# Patient Record
Sex: Female | Born: 1955
Health system: Southern US, Community
[De-identification: ages and names within clinical notes are randomized; demographics above are authoritative.]

## PROBLEM LIST (undated history)

## (undated) DIAGNOSIS — K8020 Calculus of gallbladder without cholecystitis without obstruction: Secondary | ICD-10-CM

## (undated) DIAGNOSIS — K589 Irritable bowel syndrome without diarrhea: Secondary | ICD-10-CM

## (undated) DIAGNOSIS — Z8659 Personal history of other mental and behavioral disorders: Secondary | ICD-10-CM

## (undated) DIAGNOSIS — K219 Gastro-esophageal reflux disease without esophagitis: Secondary | ICD-10-CM

## (undated) HISTORY — PX: CHOLECYSTECTOMY: SHX55

## (undated) HISTORY — PX: APPENDECTOMY: SHX54

## (undated) HISTORY — DX: Personal history of other mental and behavioral disorders: Z86.59

## (undated) HISTORY — DX: Irritable bowel syndrome without diarrhea: K58.9

## (undated) HISTORY — DX: Gastro-esophageal reflux disease without esophagitis: K21.9

## (undated) HISTORY — DX: Calculus of gallbladder without cholecystitis without obstruction: K80.20

---

## 1998-03-13 ENCOUNTER — Other Ambulatory Visit: Admission: RE | Admit: 1998-03-13 | Discharge: 1998-03-13 | Payer: Self-pay | Admitting: Obstetrics and Gynecology

## 1999-07-05 ENCOUNTER — Other Ambulatory Visit: Admission: RE | Admit: 1999-07-05 | Discharge: 1999-07-05 | Payer: Self-pay | Admitting: *Deleted

## 2000-11-05 ENCOUNTER — Encounter: Payer: Self-pay | Admitting: Internal Medicine

## 2000-11-05 ENCOUNTER — Ambulatory Visit (HOSPITAL_COMMUNITY): Admission: RE | Admit: 2000-11-05 | Discharge: 2000-11-05 | Payer: Self-pay | Admitting: Internal Medicine

## 2002-04-25 ENCOUNTER — Other Ambulatory Visit: Admission: RE | Admit: 2002-04-25 | Discharge: 2002-04-25 | Payer: Self-pay | Admitting: *Deleted

## 2003-08-08 ENCOUNTER — Other Ambulatory Visit: Admission: RE | Admit: 2003-08-08 | Discharge: 2003-08-08 | Payer: Self-pay | Admitting: Obstetrics and Gynecology

## 2004-10-03 ENCOUNTER — Other Ambulatory Visit: Admission: RE | Admit: 2004-10-03 | Discharge: 2004-10-03 | Payer: Self-pay | Admitting: Obstetrics and Gynecology

## 2005-04-10 ENCOUNTER — Ambulatory Visit: Payer: Self-pay | Admitting: Family Medicine

## 2005-07-01 ENCOUNTER — Ambulatory Visit: Payer: Self-pay | Admitting: Family Medicine

## 2005-09-15 ENCOUNTER — Ambulatory Visit: Payer: Self-pay | Admitting: Family Medicine

## 2005-11-05 ENCOUNTER — Ambulatory Visit (HOSPITAL_COMMUNITY): Admission: RE | Admit: 2005-11-05 | Discharge: 2005-11-06 | Payer: Self-pay | Admitting: General Surgery

## 2005-12-26 ENCOUNTER — Other Ambulatory Visit: Admission: RE | Admit: 2005-12-26 | Discharge: 2005-12-26 | Payer: Self-pay | Admitting: Obstetrics and Gynecology

## 2006-05-28 ENCOUNTER — Ambulatory Visit: Payer: Self-pay | Admitting: Internal Medicine

## 2006-07-14 ENCOUNTER — Ambulatory Visit: Payer: Self-pay | Admitting: Internal Medicine

## 2006-12-01 IMAGING — RF DG CHOLANGIOGRAM OPERATIVE
1 series · 5 of 5 positions shown · non-contrast
Comparison: none

CLINICAL DATA: Lap choley.
 OPERATIVE CHOLANGIOGRAM:

[Series 1: run · 2 acquisitions, 5 frames shown]
[im 1/2]
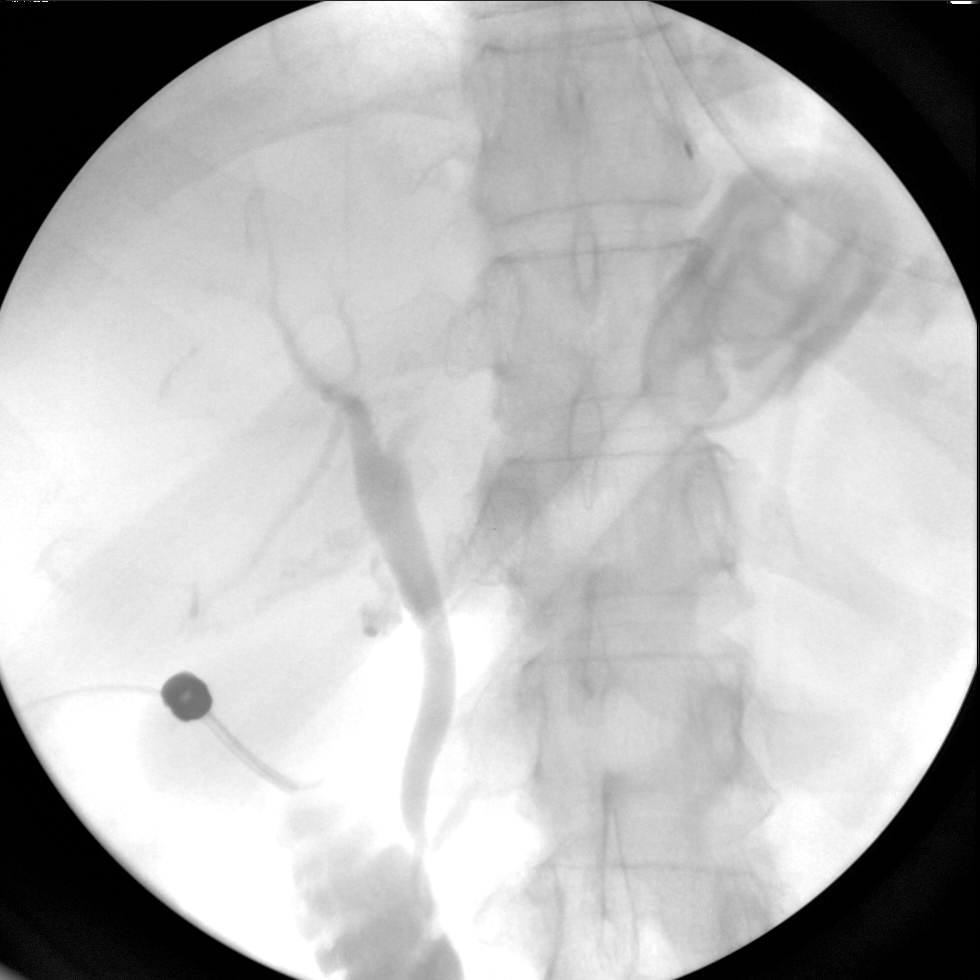
[im 1/2]
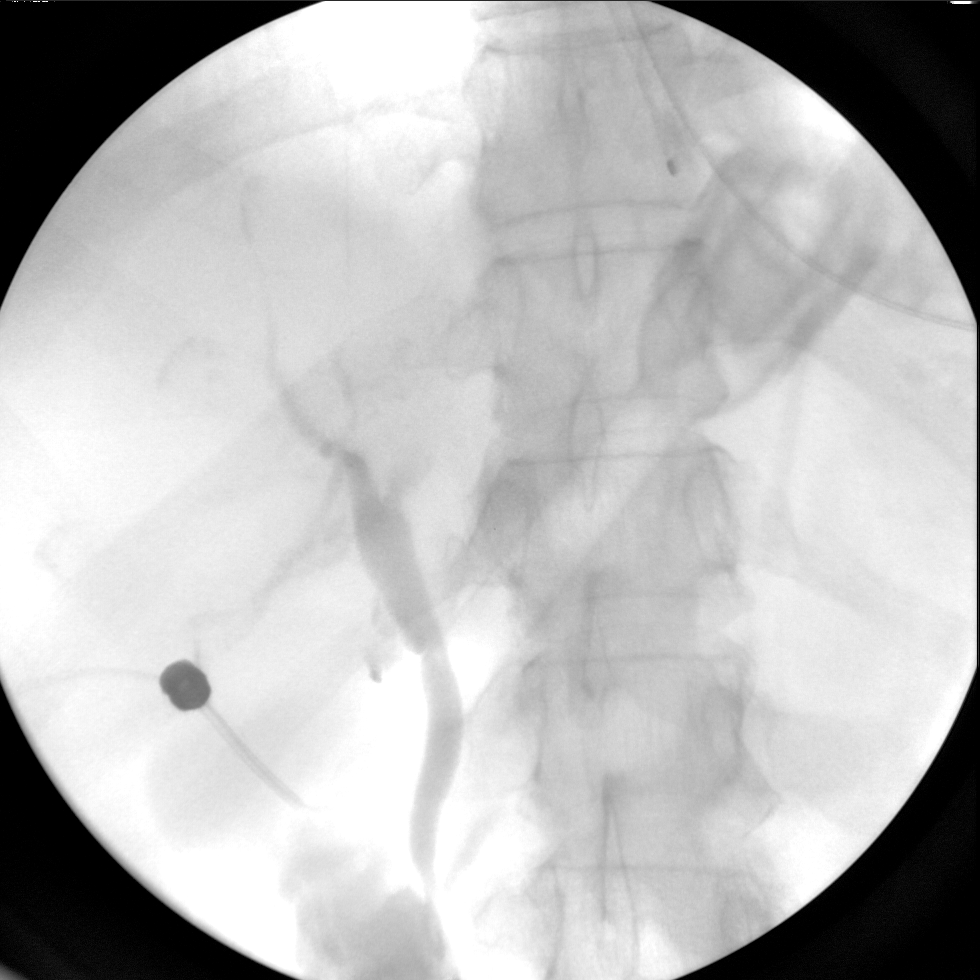
[im 1/2]
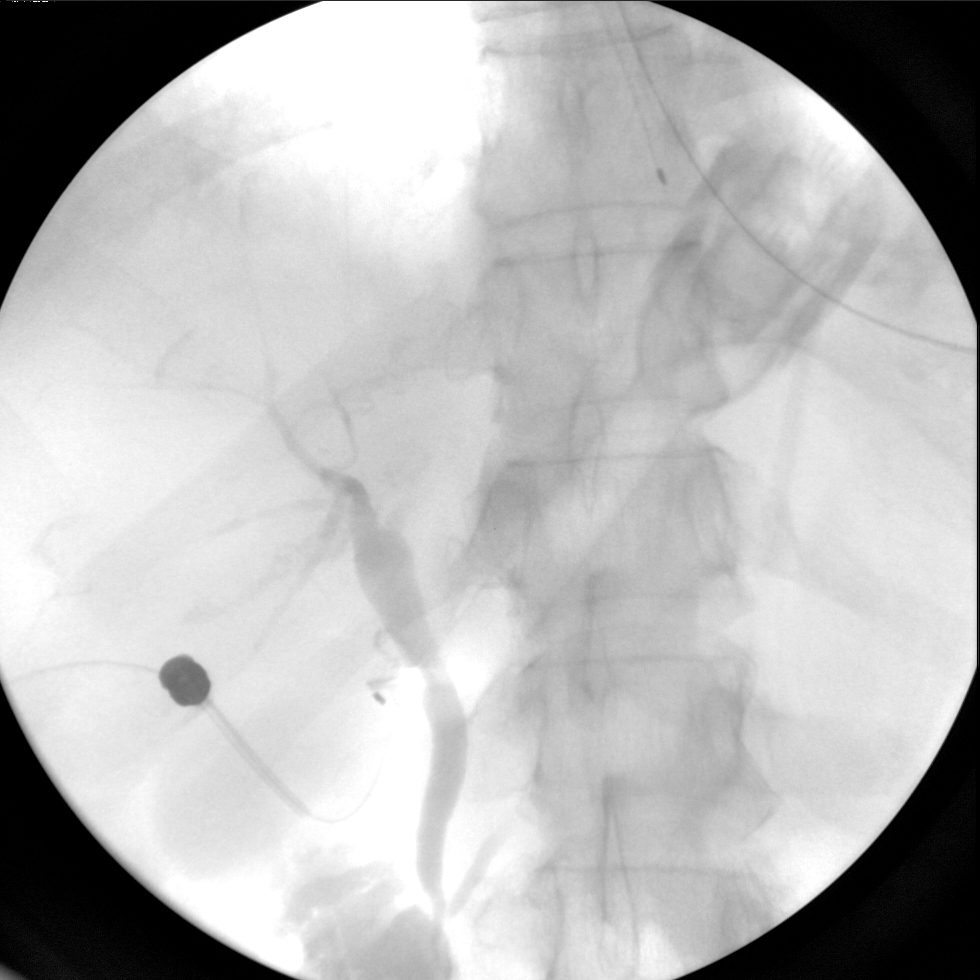
[im 1/2]
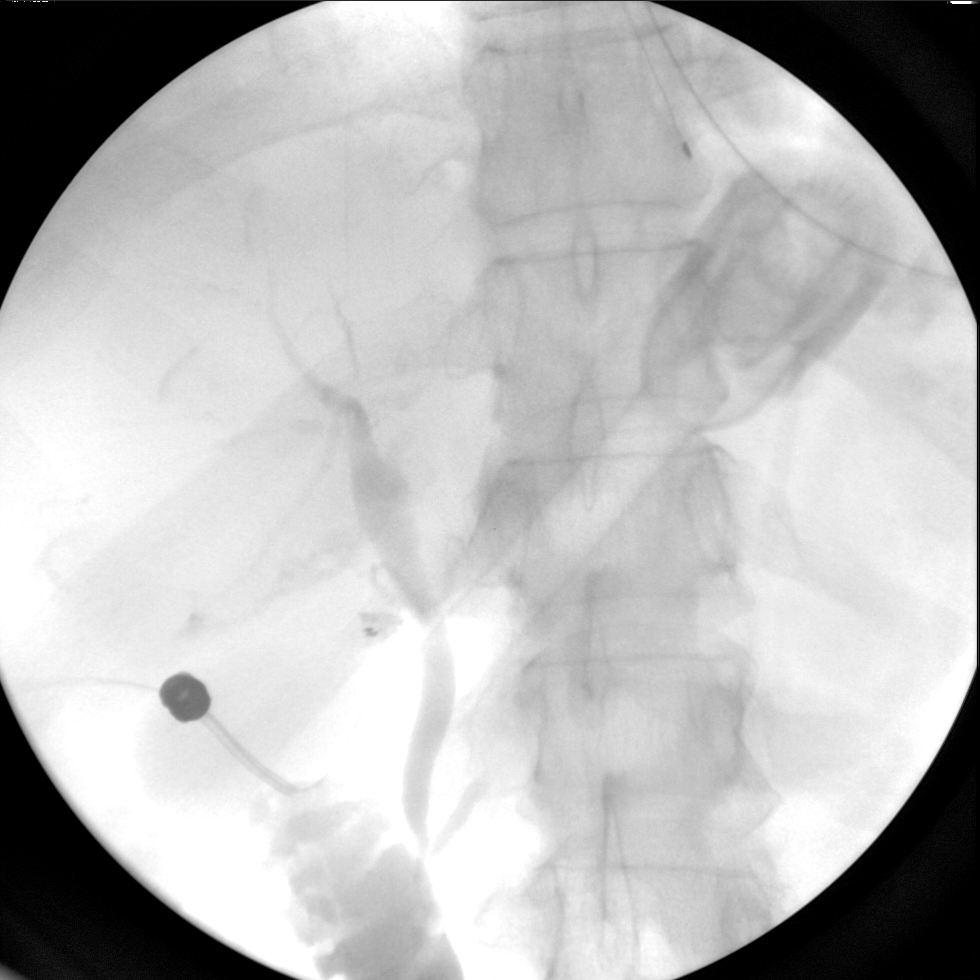
[im 2/2]
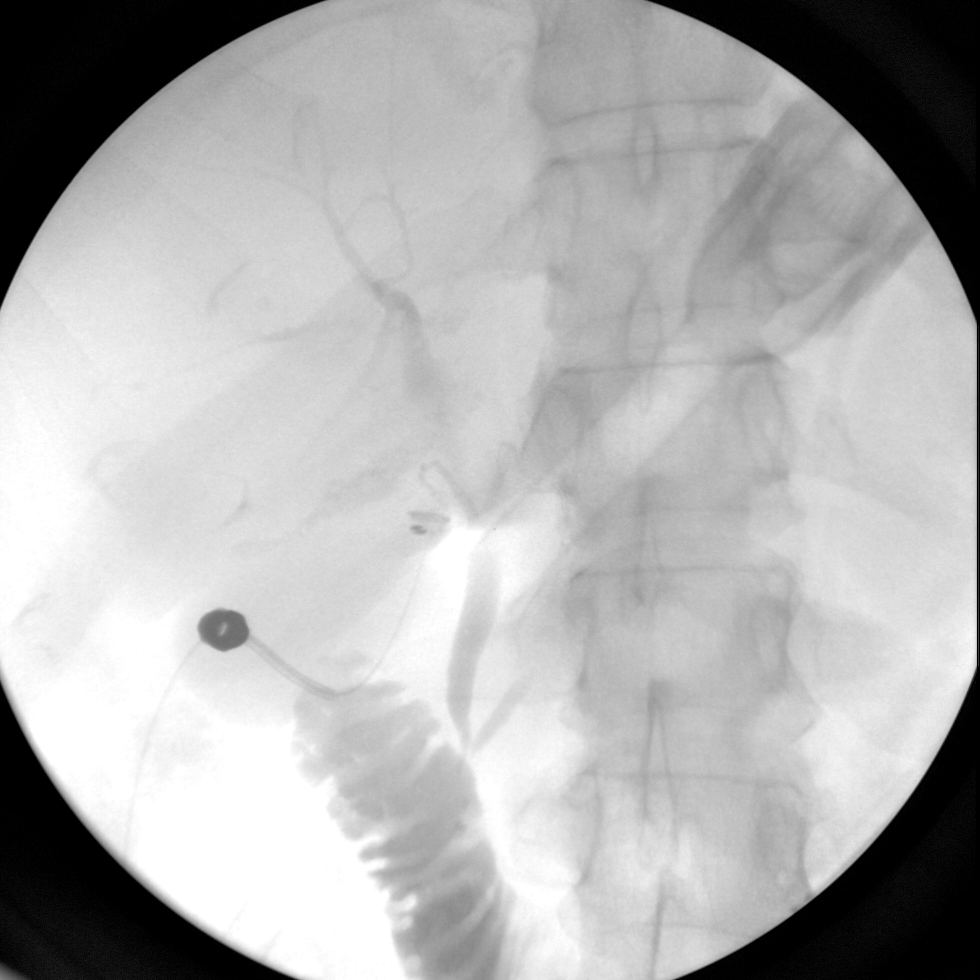

[5 of 5 positions shown; findings below may reference images not displayed]

FINDINGS: Multiple C-arm spot films were performed during operative cholangiogram.  Contrast was injected via the cystic duct.  Common bile duct opacifies well with no filling defect, and there is free passage of contrast into the duodenal loop.
IMPRESSION: Negative intraoperative cholangiogram.

## 2014-06-13 ENCOUNTER — Other Ambulatory Visit: Payer: Self-pay | Admitting: Obstetrics and Gynecology

## 2014-06-14 LAB — CYTOLOGY - PAP

## 2015-06-26 ENCOUNTER — Other Ambulatory Visit: Payer: Self-pay | Admitting: Obstetrics and Gynecology

## 2015-06-28 LAB — CYTOLOGY - PAP

## 2016-05-19 ENCOUNTER — Encounter: Payer: Self-pay | Admitting: Gastroenterology

## 2017-03-09 ENCOUNTER — Encounter: Payer: Self-pay | Admitting: Gastroenterology

## 2017-05-05 ENCOUNTER — Encounter (INDEPENDENT_AMBULATORY_CARE_PROVIDER_SITE_OTHER): Payer: Self-pay

## 2017-05-05 ENCOUNTER — Ambulatory Visit (INDEPENDENT_AMBULATORY_CARE_PROVIDER_SITE_OTHER): Payer: BC Managed Care – PPO | Admitting: Gastroenterology

## 2017-05-05 ENCOUNTER — Encounter: Payer: Self-pay | Admitting: Gastroenterology

## 2017-05-05 ENCOUNTER — Other Ambulatory Visit: Payer: Self-pay

## 2017-05-05 VITALS — BP 114/86 | HR 78 | Ht 63.78 in | Wt 147.5 lb

## 2017-05-05 DIAGNOSIS — K602 Anal fissure, unspecified: Secondary | ICD-10-CM | POA: Diagnosis not present

## 2017-05-05 DIAGNOSIS — K219 Gastro-esophageal reflux disease without esophagitis: Secondary | ICD-10-CM

## 2017-05-05 DIAGNOSIS — Z1211 Encounter for screening for malignant neoplasm of colon: Secondary | ICD-10-CM

## 2017-05-05 DIAGNOSIS — K589 Irritable bowel syndrome without diarrhea: Secondary | ICD-10-CM | POA: Diagnosis not present

## 2017-05-05 DIAGNOSIS — K6289 Other specified diseases of anus and rectum: Secondary | ICD-10-CM

## 2017-05-05 DIAGNOSIS — R197 Diarrhea, unspecified: Secondary | ICD-10-CM | POA: Diagnosis not present

## 2017-05-05 DIAGNOSIS — Z791 Long term (current) use of non-steroidal anti-inflammatories (NSAID): Secondary | ICD-10-CM

## 2017-05-05 MED ORDER — OMEPRAZOLE 40 MG PO CPDR: 40.0000 mg | DELAYED_RELEASE_CAPSULE | Freq: Every day | ORAL | 11 refills | Status: DC

## 2017-05-05 MED ORDER — RANITIDINE HCL 150 MG PO TABS: 150.0000 mg | ORAL_TABLET | Freq: Every evening | ORAL | 11 refills | Status: DC | PRN

## 2017-05-05 MED ORDER — AMBULATORY NON FORMULARY MEDICATION: 0 refills | Status: AC

## 2017-05-05 MED ORDER — BENEFIBER PO POWD: ORAL | 0 refills | Status: AC

## 2017-05-05 MED ORDER — AMBULATORY NON FORMULARY MEDICATION: Status: AC

## 2017-05-05 MED ORDER — CHOLESTYRAMINE LIGHT 4 G PO PACK: 4.0000 g | PACK | Freq: Two times a day (BID) | ORAL | 11 refills | Status: AC

## 2017-05-05 MED ORDER — SUPREP BOWEL PREP KIT 17.5-3.13-1.6 GM/177ML PO SOLN: ORAL | 0 refills | Status: DC

## 2017-05-05 NOTE — Progress Notes (Deleted)
Judy Hancock    [id number]    March 09, 1956  Primary Care Physician:Dough, Jaymes Graff, MD  Referring Physician: No referring provider defined for this encounter.  Chief complaint:  ***  HPI:  ***   Outpatient Encounter Prescriptions as of 05/05/2017  Medication Sig  . estradiol (VIVELLE-DOT) 0.05 MG/24HR patch Place onto the skin.  Marland Kitchen levothyroxine (SYNTHROID, LEVOTHROID) 125 MCG tablet Take by mouth.  Marland Kitchen omeprazole (PRILOSEC) 20 MG capsule Take 20 mg by mouth as needed.  . Probiotic Product (PROBIOTIC DAILY PO) Take by mouth as needed.  . progesterone (PROMETRIUM) 100 MG capsule Take 100 mg by mouth.   No facility-administered encounter medications on file as of 05/05/2017.     Allergies as of 05/05/2017 - Review Complete 05/05/2017  Allergen Reaction Noted  . Erythromycin base Other (See Comments) 11/13/2015  . Procaine Other (See Comments) 11/13/2015  . Sulfamethoxazole Other (See Comments) 11/13/2015    Past Medical History:  Diagnosis Date  . Gall stones   . GERD (gastroesophageal reflux disease)   . History of depression   . IBS (irritable bowel syndrome)     No past surgical history on file.  No family history on file.  Social History   Social History  . Marital status: Married    Spouse name: N/A  . Number of children: N/A  . Years of education: N/A   Occupational History  . Not on file.   Social History Main Topics  . Smoking status: Not on file  . Smokeless tobacco: Not on file  . Alcohol use Not on file  . Drug use: Unknown  . Sexual activity: Not on file   Other Topics Concern  . Not on file   Social History Narrative  . No narrative on file      Review of systems: Review of Systems  Constitutional: Negative for fever and chills.  HENT: Negative.   Eyes: Negative for blurred vision.  Respiratory: Negative for cough, shortness of breath and wheezing.   Cardiovascular: Negative for chest pain and palpitations.    Gastrointestinal: as per HPI Genitourinary: Negative for dysuria, urgency, frequency and hematuria.  Musculoskeletal: Negative for myalgias, back pain and joint pain.  Skin: Negative for itching and rash.  Neurological: Negative for dizziness, tremors, focal weakness, seizures and loss of consciousness.  Endo/Heme/Allergies: Positive for seasonal allergies.  Psychiatric/Behavioral: Negative for depression, suicidal ideas and hallucinations.  All other systems reviewed and are negative.   Physical Exam: There were no vitals filed for this visit. Body mass index is 25.49 kg/m. Gen:      No acute distress HEENT:  EOMI, sclera anicteric Neck:     No masses; no thyromegaly Lungs:    Clear to auscultation bilaterally; normal respiratory effort CV:         Regular rate and rhythm; no murmurs Abd:      + bowel sounds; soft, non-tender; no palpable masses, no distension Ext:    No edema; adequate peripheral perfusion Skin:      Warm and dry; no rash Neuro: alert and oriented x 3 Psych: normal mood and affect  Data Reviewed:  Reviewed labs, radiology imaging, old records and pertinent past GI work up   Assessment and Plan/Recommendations:  ***  25 minutes was spent face-to-face with the patient. Greater than 50% of the time used for counseling as well as treatment plan and follow-up. She had multiple questions which were answered to her  satisfaction  Damaris Hippo , MD [phone] Mon-Fri 8a-5p [phone] after 5p, weekends, holidays  CC: No ref. provider found

## 2017-05-05 NOTE — Progress Notes (Signed)
Judy Hancock    [id number]    22-Feb-1956  Primary Care Physician:Dough, Jaymes Graff, MD.   Referring Physician: No referring provider defined for this encounter.  Chief complaint:  Rectal pain,   HPI: 82 yr F here for new patient evaluation, she was previously followed by Dr. Olevia Perches. She has history of chronic IBS with intermittent diarrhea . She underwent EGD and Colonoscopy in October 2007 with random biopsies of colon and duodenum were normal, negative for microscopic colitis or celiac disease. Patient is here with complaints of exacerbation of diarrhea for the past 3 weeks, she has multiple bowel movements in the morning and most of the time she doesn't have any bowel movement after 3 PM. Denies any nocturnal symptoms. Denies any blood in stool or melena. Patient also complains of intermittent rectal pain that wakes her up from sleep, she thinks she may have had 4 or 5 episodes in the past 1-2 years She is currently taking probiotics. Patient also has increased reflux symptoms, nausea, heartburn and regurgitation. Denies dysphagia or odynophagia. She takes NSAIDs intermittently, was taking more frequently a few weeks ago.   Outpatient Encounter Prescriptions as of 05/05/2017  Medication Sig  . estradiol (VIVELLE-DOT) 0.05 MG/24HR patch Place onto the skin.  Marland Kitchen levothyroxine (SYNTHROID, LEVOTHROID) 125 MCG tablet Take by mouth.  Marland Kitchen omeprazole (PRILOSEC) 20 MG capsule Take 20 mg by mouth as needed.  . Probiotic Product (PROBIOTIC DAILY PO) Take by mouth as needed.  . progesterone (PROMETRIUM) 100 MG capsule Take 100 mg by mouth.   No facility-administered encounter medications on file as of 05/05/2017.     Allergies as of 05/05/2017 - Review Complete 05/05/2017  Allergen Reaction Noted  . Erythromycin base Other (See Comments) 11/13/2015  . Procaine Other (See Comments) 11/13/2015  . Sulfamethoxazole Other (See Comments) 11/13/2015    Past Medical History:  Diagnosis Date   . Gall stones   . GERD (gastroesophageal reflux disease)   . History of depression   . IBS (irritable bowel syndrome)     Past Surgical History:  Procedure Laterality Date  . APPENDECTOMY    . CHOLECYSTECTOMY      Family History  Problem Relation Age of Onset  . Lung cancer Mother   . Throat cancer Mother   . Osteoporosis Mother   . Hypertension Mother   . Alcoholism Mother   . Heart disease Father   . Diabetes Paternal Grandmother   . Heart disease Paternal Grandfather   . Colon cancer Neg Hx   . Rectal cancer Neg Hx   . Liver cancer Neg Hx     Social History   Social History  . Marital status: Married    Spouse name: N/A  . Number of children: 3  . Years of education: N/A   Occupational History  . retired     Education officer, [employer]   Social History Main Topics  . Smoking status: Never Smoker  . Smokeless tobacco: Never Used  . Alcohol use Not on file     Comment: socially  . Drug use: No  . Sexual activity: Not on file   Other Topics Concern  . Not on file   Social History Narrative  . No narrative on file      Review of systems: Review of Systems  Constitutional: Negative for fever and chills.  HENT: Negative.   Eyes: Negative for blurred vision.  Respiratory: Negative for cough, shortness of breath  and wheezing.   Cardiovascular: Negative for chest pain and palpitations.  Gastrointestinal: as per HPI Genitourinary: Negative for dysuria, urgency, frequency and hematuria.  Musculoskeletal: Negative for myalgias, back pain and joint pain.  Skin: Negative for itching and rash.  Neurological: Negative for dizziness, tremors, focal weakness, seizures and loss of consciousness.  Endo/Heme/Allergies: Positive for seasonal allergies.  Psychiatric/Behavioral: Negative for depression, suicidal ideas and hallucinations.  All other systems reviewed and are negative.   Physical Exam: Vitals:   05/05/17 1338  BP: 114/86  Pulse: 78   Body mass index is  25.49 kg/m. Gen:      No acute distress HEENT:  EOMI, sclera anicteric Neck:     No masses; no thyromegaly Lungs:    Clear to auscultation bilaterally; normal respiratory effort CV:         Regular rate and rhythm; no murmurs Abd:      + bowel sounds; soft, non-tender; no palpable masses, no distension Ext:    No edema; adequate peripheral perfusion Skin:      Warm and dry; no rash Neuro: alert and oriented x 3 Psych: normal mood and affect Rectal exam: Normal anal sphincter tone or external hemorrhoids Anoscopy: Small internal hemorrhoids, no active bleeding, 2 small anterior anal fissure, no visible nodules  Data Reviewed:  Reviewed labs, radiology imaging, old records and pertinent past GI work up  Colonoscopy with random biopsies 07/14/2016: Normal EGD 07/14/2016: Gastric and small bowel biopsies normal  Assessment and Plan/Recommendations: 61 year old female with history of IBS predominant diarrhea, previously followed by Dr. Olevia Perches is here to reestablish care  Intermittent rectal pain could be secondary to anal fissure: Advised patient to apply rectal nitroglycerin 0.125% small pea-sized amount twice daily for 4-6 weeks  Patient is due for screening colonoscopy We will schedule it The risks and benefits as well as alternatives of endoscopic procedure(s) have been discussed and reviewed. All questions answered. The patient agrees to proceed.   Diarrhea: Acute on chronic, likely exacerbation of IBS symptoms Lactose-free diet Trial of cholestyramine twice daily . Status post cholecystectomy ? Bile salt induced diarrhea Continue probiotic Benefiber 1 tablespoon 3 times a day with meals  GERD and epigastric abdominal pain: Schedule for EGD for evaluation Omeprazole 40 mg daily 30 minutes before breakfast and ranitidine 150 mg at bedtime as needed FD Gard 1 capsule 3 times a day as needed If patient has significant improvement of GERD symptoms and epigastric abdominal pain  advised her to call and cancel the procedure Avoid NSAID's   K. Denzil Magnuson , MD [phone] Mon-Fri 8a-5p [phone] after 5p, weekends, holidays  CC: No ref. provider found

## 2017-05-05 NOTE — Patient Instructions (Addendum)
You have been scheduled for an endoscopy and colonoscopy. Please follow the written instructions given to you at your visit today. Please pick up your prep supplies at the pharmacy within the next 1-3 days. If you use inhalers (even only as needed), please bring them with you on the day of your procedure. Your physician has requested that you go to www.startemmi.com and enter the access code given to you at your visit today. This web site gives a general overview about your procedure. However, you should still follow specific instructions given to you by our office regarding your preparation for the procedure.  We have sent the following medications to your pharmacy for you to pick up at your convenience:  Cholestyramine  Rectal Nitroglycerine - faxed to [facility] in [place] Omeprazole Zantac  Please purchase the following medications over the counter and take as directed: Benefiber FDgard  If you are age 28 or older, your body mass index should be between 23-30. Your Body mass index is 25.49 kg/m. If this is out of the aforementioned range listed, please consider follow up with your Primary Care Provider.  If you are age 42 or younger, your body mass index should be between 19-25. Your Body mass index is 25.49 kg/m. If this is out of the aformentioned range listed, please consider follow up with your Primary Care Provider.                 Please purchase the following medications over the counter and take as directed:

## 2017-05-07 DIAGNOSIS — K219 Gastro-esophageal reflux disease without esophagitis: Secondary | ICD-10-CM | POA: Insufficient documentation

## 2017-05-07 DIAGNOSIS — K589 Irritable bowel syndrome without diarrhea: Secondary | ICD-10-CM | POA: Insufficient documentation

## 2017-06-24 ENCOUNTER — Encounter: Payer: Self-pay | Admitting: Gastroenterology

## 2017-07-03 ENCOUNTER — Ambulatory Visit (AMBULATORY_SURGERY_CENTER): Payer: BC Managed Care – PPO | Admitting: Gastroenterology

## 2017-07-03 ENCOUNTER — Encounter: Payer: Self-pay | Admitting: Gastroenterology

## 2017-07-03 VITALS — BP 97/77 | HR 74 | Temp 97.5°F | Resp 17 | Ht 63.0 in | Wt 147.0 lb

## 2017-07-03 DIAGNOSIS — K219 Gastro-esophageal reflux disease without esophagitis: Secondary | ICD-10-CM

## 2017-07-03 DIAGNOSIS — Z1211 Encounter for screening for malignant neoplasm of colon: Secondary | ICD-10-CM

## 2017-07-03 MED ORDER — SODIUM CHLORIDE 0.9 % IV SOLN: 500.0000 mL | INTRAVENOUS | Status: DC

## 2017-07-03 NOTE — Patient Instructions (Signed)
YOU HAD AN ENDOSCOPIC PROCEDURE TODAY AT [facility]:   Refer to the procedure report that was given to you for any specific questions about what was found during the examination.  If the procedure report does not answer your questions, please call your gastroenterologist to clarify.  If you requested that your care partner not be given the details of your procedure findings, then the procedure report has been included in a sealed envelope for you to review at your convenience later.  YOU SHOULD EXPECT: Some feelings of bloating in the abdomen. Passage of more gas than usual.  Walking can help get rid of the air that was put into your GI tract during the procedure and reduce the bloating. If you had a lower endoscopy (such as a colonoscopy or flexible sigmoidoscopy) you may notice spotting of blood in your stool or on the toilet paper. If you underwent a bowel prep for your procedure, you may not have a normal bowel movement for a few days.  Please Note:  You might notice some irritation and congestion in your nose or some drainage.  This is from the oxygen used during your procedure.  There is no need for concern and it should clear up in a day or so.  SYMPTOMS TO REPORT IMMEDIATELY:   Following lower endoscopy (colonoscopy or flexible sigmoidoscopy):  Excessive amounts of blood in the stool  Significant tenderness or worsening of abdominal pains  Swelling of the abdomen that is new, acute  Fever of 100F or higher   Following upper endoscopy (EGD)  Vomiting of blood or coffee ground material  New chest pain or pain under the shoulder blades  Painful or persistently difficult swallowing  New shortness of breath  Fever of 100F or higher  Black, tarry-looking stools  For urgent or emergent issues, a gastroenterologist can be reached at any hour by calling [phone].   DIET:  We do recommend a small meal at first, but then you may proceed to your regular diet.  Drink  plenty of fluids but you should avoid alcoholic beverages for 24 hours.  ACTIVITY:  You should plan to take it easy for the rest of today and you should NOT DRIVE or use heavy machinery until tomorrow (because of the sedation medicines used during the test).    FOLLOW UP: Our staff will call the number listed on your records the next business day following your procedure to check on you and address any questions or concerns that you may have regarding the information given to you following your procedure. If we do not reach you, we will leave a message.  However, if you are feeling well and you are not experiencing any problems, there is no need to return our call.  We will assume that you have returned to your regular daily activities without incident.  If any biopsies were taken you will be contacted by phone or by letter within the next 1-3 weeks.  Please call us at [phone] if you have not heard about the biopsies in 3 weeks.    SIGNATURES/CONFIDENTIALITY: You and/or your care partner have signed paperwork which will be entered into your electronic medical record.  These signatures attest to the fact that that the information above on your After Visit Summary has been reviewed and is understood.  Full responsibility of the confidentiality of this discharge information lies with you and/or your care-partner.  Esophagitis abd anti reflux information given.  No aspirin, ibuprofen, or  other non steroidal anti inflammatory meds given.  Diverticulosis, hemorrhoids, high fiber diet given.  Recall colonoscopy 10 years-2028.

## 2017-07-03 NOTE — Op Note (Signed)
[facility] Patient Name: Georgean Spainhower Procedure Date: 07/03/2017 8:34 AM MRN: [id number] Endoscopist: Mauri Pole , MD Age: 61 Referring MD:  Date of Birth: [birth year] Gender: Female Account #: [id number] Procedure:                Colonoscopy Indications:              Screening for colorectal malignant neoplasm Medicines:                Monitored Anesthesia Care Procedure:                Pre-Anesthesia Assessment:                           - Prior to the procedure, a History and Physical                            was performed, and patient medications and                            allergies were reviewed. The patient's tolerance of                            previous anesthesia was also reviewed. The risks                            and benefits of the procedure and the sedation                            options and risks were discussed with the patient.                            All questions were answered, and informed consent                            was obtained. Prior Anticoagulants: The patient has                            taken no previous anticoagulant or antiplatelet                            agents. ASA Grade Assessment: II - A patient with                            mild systemic disease. After reviewing the risks                            and benefits, the patient was deemed in                            satisfactory condition to undergo the procedure.                           After obtaining informed consent, the colonoscope  was passed under direct vision. Throughout the                            procedure, the patient's blood pressure, pulse, and                            oxygen saturations were monitored continuously. The                            Colonoscope was introduced through the anus and                            advanced to the the cecum, identified by                            appendiceal orifice  and ileocecal valve. The                            colonoscopy was performed without difficulty. The                            patient tolerated the procedure well. The quality                            of the bowel preparation was excellent. The                            ileocecal valve, appendiceal orifice, and rectum                            were photographed. Scope In: 8:47:13 AM Scope Out: 9:02:44 AM Scope Withdrawal Time: 0 hours 9 minutes 54 seconds  Total Procedure Duration: 0 hours 15 minutes 31 seconds  Findings:                 The perianal and digital rectal examinations were                            normal.                           A few small-mouthed diverticula were found in the                            sigmoid colon, descending colon and ascending colon.                           Non-bleeding internal hemorrhoids were found during                            retroflexion. The hemorrhoids were small.                           The exam was otherwise without abnormality. Complications:            No immediate complications. Estimated Blood  Loss:     Estimated blood loss: none. Impression:               - Diverticulosis in the sigmoid colon, in the                            descending colon and in the ascending colon.                           - Non-bleeding internal hemorrhoids.                           - The examination was otherwise normal.                           - No specimens collected. Recommendation:           - Patient has a contact number available for                            emergencies. The signs and symptoms of potential                            delayed complications were discussed with the                            patient. Return to normal activities tomorrow.                            Written discharge instructions were provided to the                            patient.                           - Resume previous diet.                            - Continue present medications.                           - Repeat colonoscopy in 10 years for screening                            purposes.                           - Return to GI clinic PRN. Mauri Pole, MD 07/03/2017 9:10:59 AM This report has been signed electronically.

## 2017-07-03 NOTE — Progress Notes (Signed)
To PACU, VSS. Report to [email or web address] 

## 2017-07-03 NOTE — Op Note (Signed)
[facility] Patient Name: Judy Hancock Procedure Date: 07/03/2017 8:34 AM MRN: [id number] Endoscopist: Mauri Pole , MD Age: 61 Referring MD:  Date of Birth: April 11, 1956 Gender: Female Account #: [id number] Procedure:                Upper GI endoscopy Indications:              Epigastric abdominal pain, Gastro-esophageal reflux                            disease Medicines:                Monitored Anesthesia Care Procedure:                Pre-Anesthesia Assessment:                           - Prior to the procedure, a History and Physical                            was performed, and patient medications and                            allergies were reviewed. The patient's tolerance of                            previous anesthesia was also reviewed. The risks                            and benefits of the procedure and the sedation                            options and risks were discussed with the patient.                            All questions were answered, and informed consent                            was obtained. Prior Anticoagulants: The patient has                            taken no previous anticoagulant or antiplatelet                            agents. ASA Grade Assessment: II - A patient with                            mild systemic disease. After reviewing the risks                            and benefits, the patient was deemed in                            satisfactory condition to undergo the procedure.  After obtaining informed consent, the endoscope was                            passed under direct vision. Throughout the                            procedure, the patient's blood pressure, pulse, and                            oxygen saturations were monitored continuously. The                            Endoscope was introduced through the mouth, and                            advanced to the second part of duodenum.  The upper                            GI endoscopy was accomplished without difficulty.                            The patient tolerated the procedure well. Scope In: Scope Out: Findings:                 LA Grade B (one or more mucosal breaks greater than                            5 mm, not extending between the tops of two mucosal                            folds) esophagitis with bleeding was found 33 to 36                            cm from the incisors.                           No gross lesions were noted in the entire examined                            stomach.                           The cardia and gastric fundus were normal on                            retroflexion.                           The examined duodenum was normal. Complications:            No immediate complications. Estimated Blood Loss:     Estimated blood loss: none. Impression:               - LA Grade B reflux esophagitis.                           -  No gross lesions in the stomach.                           - Normal examined duodenum.                           - No specimens collected. Recommendation:           - Patient has a contact number available for                            emergencies. The signs and symptoms of potential                            delayed complications were discussed with the                            patient. Return to normal activities tomorrow.                            Written discharge instructions were provided to the                            patient.                           - Resume previous diet.                           - Continue present medications.                           - No aspirin, ibuprofen, naproxen, or other                            non-steroidal anti-inflammatory drugs.                           - Follow an antireflux regimen. Mauri Pole, MD 07/03/2017 9:08:46 AM This report has been signed electronically.

## 2017-07-06 ENCOUNTER — Telehealth: Payer: Self-pay

## 2017-07-06 NOTE — Telephone Encounter (Signed)
  Follow up Call-  Call back number 07/03/2017  Post procedure Call Back phone  # [phone]  Permission to leave phone message Yes  Some recent data might be hidden     Patient questions:  Do you have a fever, pain , or abdominal swelling? No. Pain Score  0 *  Have you tolerated food without any problems? Yes.    Have you been able to return to your normal activities? Yes.    Do you have any questions about your discharge instructions: Diet   No. Medications  No. Follow up visit  No.  Do you have questions or concerns about your Care? No.  Actions: * If pain score is 4 or above: No action needed, pain <4.

## 2018-05-14 ENCOUNTER — Other Ambulatory Visit: Payer: Self-pay | Admitting: Gastroenterology

## 2020-11-20 DIAGNOSIS — Z1231 Encounter for screening mammogram for malignant neoplasm of breast: Secondary | ICD-10-CM | POA: Diagnosis not present

## 2020-11-23 DIAGNOSIS — F331 Major depressive disorder, recurrent, moderate: Secondary | ICD-10-CM | POA: Diagnosis not present

## 2020-11-23 DIAGNOSIS — Z6824 Body mass index (BMI) 24.0-24.9, adult: Secondary | ICD-10-CM | POA: Diagnosis not present

## 2020-11-23 DIAGNOSIS — R030 Elevated blood-pressure reading, without diagnosis of hypertension: Secondary | ICD-10-CM | POA: Diagnosis not present

## 2020-12-24 DIAGNOSIS — F331 Major depressive disorder, recurrent, moderate: Secondary | ICD-10-CM | POA: Diagnosis not present

## 2020-12-24 DIAGNOSIS — Z6823 Body mass index (BMI) 23.0-23.9, adult: Secondary | ICD-10-CM | POA: Diagnosis not present

## 2021-01-21 DIAGNOSIS — E039 Hypothyroidism, unspecified: Secondary | ICD-10-CM | POA: Diagnosis not present

## 2021-01-21 DIAGNOSIS — Z1339 Encounter for screening examination for other mental health and behavioral disorders: Secondary | ICD-10-CM | POA: Diagnosis not present

## 2021-01-21 DIAGNOSIS — R030 Elevated blood-pressure reading, without diagnosis of hypertension: Secondary | ICD-10-CM | POA: Diagnosis not present

## 2021-01-21 DIAGNOSIS — Z0000 Encounter for general adult medical examination without abnormal findings: Secondary | ICD-10-CM | POA: Diagnosis not present

## 2021-01-21 DIAGNOSIS — Z1211 Encounter for screening for malignant neoplasm of colon: Secondary | ICD-10-CM | POA: Diagnosis not present

## 2021-01-21 DIAGNOSIS — Z139 Encounter for screening, unspecified: Secondary | ICD-10-CM | POA: Diagnosis not present

## 2021-01-21 DIAGNOSIS — Z136 Encounter for screening for cardiovascular disorders: Secondary | ICD-10-CM | POA: Diagnosis not present

## 2021-01-21 DIAGNOSIS — Z1331 Encounter for screening for depression: Secondary | ICD-10-CM | POA: Diagnosis not present

## 2021-01-21 DIAGNOSIS — Z13220 Encounter for screening for lipoid disorders: Secondary | ICD-10-CM | POA: Diagnosis not present

## 2021-04-15 DIAGNOSIS — N950 Postmenopausal bleeding: Secondary | ICD-10-CM | POA: Diagnosis not present

## 2021-04-24 DIAGNOSIS — N950 Postmenopausal bleeding: Secondary | ICD-10-CM | POA: Diagnosis not present

## 2021-05-13 DIAGNOSIS — R9389 Abnormal findings on diagnostic imaging of other specified body structures: Secondary | ICD-10-CM | POA: Diagnosis not present

## 2021-05-13 DIAGNOSIS — N950 Postmenopausal bleeding: Secondary | ICD-10-CM | POA: Diagnosis not present

## 2021-06-19 DIAGNOSIS — N858 Other specified noninflammatory disorders of uterus: Secondary | ICD-10-CM | POA: Diagnosis not present

## 2021-06-19 DIAGNOSIS — N950 Postmenopausal bleeding: Secondary | ICD-10-CM | POA: Diagnosis not present

## 2021-06-19 DIAGNOSIS — D259 Leiomyoma of uterus, unspecified: Secondary | ICD-10-CM | POA: Diagnosis not present

## 2021-06-19 DIAGNOSIS — R9389 Abnormal findings on diagnostic imaging of other specified body structures: Secondary | ICD-10-CM | POA: Diagnosis not present

## 2021-06-19 DIAGNOSIS — N840 Polyp of corpus uteri: Secondary | ICD-10-CM | POA: Diagnosis not present

## 2021-07-23 DIAGNOSIS — R030 Elevated blood-pressure reading, without diagnosis of hypertension: Secondary | ICD-10-CM | POA: Diagnosis not present

## 2021-07-23 DIAGNOSIS — E039 Hypothyroidism, unspecified: Secondary | ICD-10-CM | POA: Diagnosis not present

## 2021-07-23 DIAGNOSIS — Z13220 Encounter for screening for lipoid disorders: Secondary | ICD-10-CM | POA: Diagnosis not present

## 2021-08-02 DIAGNOSIS — E039 Hypothyroidism, unspecified: Secondary | ICD-10-CM | POA: Diagnosis not present

## 2021-08-02 DIAGNOSIS — F331 Major depressive disorder, recurrent, moderate: Secondary | ICD-10-CM | POA: Diagnosis not present

## 2021-08-02 DIAGNOSIS — R030 Elevated blood-pressure reading, without diagnosis of hypertension: Secondary | ICD-10-CM | POA: Diagnosis not present

## 2021-08-02 DIAGNOSIS — Z23 Encounter for immunization: Secondary | ICD-10-CM | POA: Diagnosis not present

## 2021-09-17 DIAGNOSIS — J029 Acute pharyngitis, unspecified: Secondary | ICD-10-CM | POA: Diagnosis not present

## 2021-09-17 DIAGNOSIS — U071 COVID-19: Secondary | ICD-10-CM | POA: Diagnosis not present

## 2021-11-21 DIAGNOSIS — H524 Presbyopia: Secondary | ICD-10-CM | POA: Diagnosis not present

## 2021-11-27 DIAGNOSIS — Z1231 Encounter for screening mammogram for malignant neoplasm of breast: Secondary | ICD-10-CM | POA: Diagnosis not present

## 2021-11-28 DIAGNOSIS — D485 Neoplasm of uncertain behavior of skin: Secondary | ICD-10-CM | POA: Diagnosis not present

## 2022-01-14 DIAGNOSIS — D485 Neoplasm of uncertain behavior of skin: Secondary | ICD-10-CM | POA: Diagnosis not present

## 2022-01-14 DIAGNOSIS — D3102 Benign neoplasm of left conjunctiva: Secondary | ICD-10-CM | POA: Diagnosis not present

## 2022-02-25 DIAGNOSIS — Z1339 Encounter for screening examination for other mental health and behavioral disorders: Secondary | ICD-10-CM | POA: Diagnosis not present

## 2022-02-25 DIAGNOSIS — Z789 Other specified health status: Secondary | ICD-10-CM | POA: Diagnosis not present

## 2022-02-25 DIAGNOSIS — Z0000 Encounter for general adult medical examination without abnormal findings: Secondary | ICD-10-CM | POA: Diagnosis not present

## 2022-02-25 DIAGNOSIS — Z139 Encounter for screening, unspecified: Secondary | ICD-10-CM | POA: Diagnosis not present

## 2022-02-25 DIAGNOSIS — Z1331 Encounter for screening for depression: Secondary | ICD-10-CM | POA: Diagnosis not present

## 2022-02-25 DIAGNOSIS — Z136 Encounter for screening for cardiovascular disorders: Secondary | ICD-10-CM | POA: Diagnosis not present

## 2022-02-25 DIAGNOSIS — Z1389 Encounter for screening for other disorder: Secondary | ICD-10-CM | POA: Diagnosis not present

## 2022-10-28 DIAGNOSIS — Z9181 History of falling: Secondary | ICD-10-CM | POA: Diagnosis not present

## 2022-10-28 DIAGNOSIS — F331 Major depressive disorder, recurrent, moderate: Secondary | ICD-10-CM | POA: Diagnosis not present

## 2022-10-28 DIAGNOSIS — I10 Essential (primary) hypertension: Secondary | ICD-10-CM | POA: Diagnosis not present

## 2022-10-28 DIAGNOSIS — E039 Hypothyroidism, unspecified: Secondary | ICD-10-CM | POA: Diagnosis not present

## 2022-10-28 DIAGNOSIS — Z6825 Body mass index (BMI) 25.0-25.9, adult: Secondary | ICD-10-CM | POA: Diagnosis not present

## 2022-10-28 DIAGNOSIS — R030 Elevated blood-pressure reading, without diagnosis of hypertension: Secondary | ICD-10-CM | POA: Diagnosis not present

## 2022-10-28 DIAGNOSIS — Z13220 Encounter for screening for lipoid disorders: Secondary | ICD-10-CM | POA: Diagnosis not present

## 2022-11-27 DIAGNOSIS — I10 Essential (primary) hypertension: Secondary | ICD-10-CM | POA: Diagnosis not present

## 2022-12-08 DIAGNOSIS — I10 Essential (primary) hypertension: Secondary | ICD-10-CM | POA: Diagnosis not present

## 2022-12-08 DIAGNOSIS — Z6825 Body mass index (BMI) 25.0-25.9, adult: Secondary | ICD-10-CM | POA: Diagnosis not present

## 2022-12-08 DIAGNOSIS — E039 Hypothyroidism, unspecified: Secondary | ICD-10-CM | POA: Diagnosis not present

## 2022-12-10 DIAGNOSIS — Z1231 Encounter for screening mammogram for malignant neoplasm of breast: Secondary | ICD-10-CM | POA: Diagnosis not present

## 2022-12-10 DIAGNOSIS — M8589 Other specified disorders of bone density and structure, multiple sites: Secondary | ICD-10-CM | POA: Diagnosis not present

## 2022-12-10 DIAGNOSIS — Z780 Asymptomatic menopausal state: Secondary | ICD-10-CM | POA: Diagnosis not present

## 2022-12-28 DIAGNOSIS — I10 Essential (primary) hypertension: Secondary | ICD-10-CM | POA: Diagnosis not present

## 2023-02-26 DIAGNOSIS — M6701 Short Achilles tendon (acquired), right ankle: Secondary | ICD-10-CM | POA: Diagnosis not present

## 2023-02-26 DIAGNOSIS — M722 Plantar fascial fibromatosis: Secondary | ICD-10-CM | POA: Diagnosis not present

## 2023-03-24 DIAGNOSIS — Z23 Encounter for immunization: Secondary | ICD-10-CM | POA: Diagnosis not present

## 2023-03-24 DIAGNOSIS — Z1389 Encounter for screening for other disorder: Secondary | ICD-10-CM | POA: Diagnosis not present

## 2023-03-24 DIAGNOSIS — Z1231 Encounter for screening mammogram for malignant neoplasm of breast: Secondary | ICD-10-CM | POA: Diagnosis not present

## 2023-03-24 DIAGNOSIS — Z1339 Encounter for screening examination for other mental health and behavioral disorders: Secondary | ICD-10-CM | POA: Diagnosis not present

## 2023-03-24 DIAGNOSIS — Z13220 Encounter for screening for lipoid disorders: Secondary | ICD-10-CM | POA: Diagnosis not present

## 2023-03-24 DIAGNOSIS — Z6825 Body mass index (BMI) 25.0-25.9, adult: Secondary | ICD-10-CM | POA: Diagnosis not present

## 2023-03-24 DIAGNOSIS — Z0000 Encounter for general adult medical examination without abnormal findings: Secondary | ICD-10-CM | POA: Diagnosis not present

## 2023-03-24 DIAGNOSIS — E039 Hypothyroidism, unspecified: Secondary | ICD-10-CM | POA: Diagnosis not present

## 2023-03-24 DIAGNOSIS — Z136 Encounter for screening for cardiovascular disorders: Secondary | ICD-10-CM | POA: Diagnosis not present

## 2023-03-24 DIAGNOSIS — I10 Essential (primary) hypertension: Secondary | ICD-10-CM | POA: Diagnosis not present

## 2023-03-24 DIAGNOSIS — Z1331 Encounter for screening for depression: Secondary | ICD-10-CM | POA: Diagnosis not present

## 2023-03-24 DIAGNOSIS — Z139 Encounter for screening, unspecified: Secondary | ICD-10-CM | POA: Diagnosis not present

## 2023-11-23 DIAGNOSIS — Z13220 Encounter for screening for lipoid disorders: Secondary | ICD-10-CM | POA: Diagnosis not present

## 2023-11-23 DIAGNOSIS — E039 Hypothyroidism, unspecified: Secondary | ICD-10-CM | POA: Diagnosis not present

## 2023-11-23 DIAGNOSIS — I10 Essential (primary) hypertension: Secondary | ICD-10-CM | POA: Diagnosis not present

## 2023-12-08 DIAGNOSIS — E785 Hyperlipidemia, unspecified: Secondary | ICD-10-CM | POA: Diagnosis not present

## 2023-12-08 DIAGNOSIS — E039 Hypothyroidism, unspecified: Secondary | ICD-10-CM | POA: Diagnosis not present

## 2023-12-08 DIAGNOSIS — Z6824 Body mass index (BMI) 24.0-24.9, adult: Secondary | ICD-10-CM | POA: Diagnosis not present

## 2023-12-08 DIAGNOSIS — I10 Essential (primary) hypertension: Secondary | ICD-10-CM | POA: Diagnosis not present

## 2023-12-08 DIAGNOSIS — F331 Major depressive disorder, recurrent, moderate: Secondary | ICD-10-CM | POA: Diagnosis not present

## 2023-12-16 DIAGNOSIS — Z1231 Encounter for screening mammogram for malignant neoplasm of breast: Secondary | ICD-10-CM | POA: Diagnosis not present

## 2024-02-19 DIAGNOSIS — Z6824 Body mass index (BMI) 24.0-24.9, adult: Secondary | ICD-10-CM | POA: Diagnosis not present

## 2024-02-19 DIAGNOSIS — Z20822 Contact with and (suspected) exposure to covid-19: Secondary | ICD-10-CM | POA: Diagnosis not present

## 2024-02-19 DIAGNOSIS — J029 Acute pharyngitis, unspecified: Secondary | ICD-10-CM | POA: Diagnosis not present

## 2024-03-02 DIAGNOSIS — E039 Hypothyroidism, unspecified: Secondary | ICD-10-CM | POA: Diagnosis not present

## 2024-03-02 DIAGNOSIS — E785 Hyperlipidemia, unspecified: Secondary | ICD-10-CM | POA: Diagnosis not present

## 2024-03-02 DIAGNOSIS — I10 Essential (primary) hypertension: Secondary | ICD-10-CM | POA: Diagnosis not present

## 2024-03-10 DIAGNOSIS — I10 Essential (primary) hypertension: Secondary | ICD-10-CM | POA: Diagnosis not present

## 2024-03-10 DIAGNOSIS — E785 Hyperlipidemia, unspecified: Secondary | ICD-10-CM | POA: Diagnosis not present

## 2024-03-10 DIAGNOSIS — Z1339 Encounter for screening examination for other mental health and behavioral disorders: Secondary | ICD-10-CM | POA: Diagnosis not present

## 2024-03-10 DIAGNOSIS — F331 Major depressive disorder, recurrent, moderate: Secondary | ICD-10-CM | POA: Diagnosis not present

## 2024-03-10 DIAGNOSIS — Z6824 Body mass index (BMI) 24.0-24.9, adult: Secondary | ICD-10-CM | POA: Diagnosis not present

## 2024-03-10 DIAGNOSIS — Z1331 Encounter for screening for depression: Secondary | ICD-10-CM | POA: Diagnosis not present

## 2024-03-10 DIAGNOSIS — Z139 Encounter for screening, unspecified: Secondary | ICD-10-CM | POA: Diagnosis not present

## 2024-03-10 DIAGNOSIS — Z136 Encounter for screening for cardiovascular disorders: Secondary | ICD-10-CM | POA: Diagnosis not present

## 2024-03-10 DIAGNOSIS — Z0000 Encounter for general adult medical examination without abnormal findings: Secondary | ICD-10-CM | POA: Diagnosis not present

## 2024-04-28 DIAGNOSIS — H25813 Combined forms of age-related cataract, bilateral: Secondary | ICD-10-CM | POA: Diagnosis not present

## 2024-04-28 DIAGNOSIS — H524 Presbyopia: Secondary | ICD-10-CM | POA: Diagnosis not present

## 2024-05-31 DIAGNOSIS — E039 Hypothyroidism, unspecified: Secondary | ICD-10-CM | POA: Diagnosis not present

## 2024-05-31 DIAGNOSIS — E785 Hyperlipidemia, unspecified: Secondary | ICD-10-CM | POA: Diagnosis not present

## 2024-06-13 DIAGNOSIS — E039 Hypothyroidism, unspecified: Secondary | ICD-10-CM | POA: Diagnosis not present

## 2024-06-13 DIAGNOSIS — Z6824 Body mass index (BMI) 24.0-24.9, adult: Secondary | ICD-10-CM | POA: Diagnosis not present

## 2024-06-13 DIAGNOSIS — F331 Major depressive disorder, recurrent, moderate: Secondary | ICD-10-CM | POA: Diagnosis not present

## 2024-06-13 DIAGNOSIS — E785 Hyperlipidemia, unspecified: Secondary | ICD-10-CM | POA: Diagnosis not present

## 2024-06-13 DIAGNOSIS — Z1331 Encounter for screening for depression: Secondary | ICD-10-CM | POA: Diagnosis not present
